# Patient Record
Sex: Male | Born: 1995 | Race: White | Hispanic: No | Marital: Single | Smoking: Never smoker
Health system: Southern US, Community
[De-identification: ages and names within clinical notes are randomized; demographics above are authoritative.]

## PROBLEM LIST (undated history)

## (undated) DIAGNOSIS — S6990XA Unspecified injury of unspecified wrist, hand and finger(s), initial encounter: Secondary | ICD-10-CM

## (undated) DIAGNOSIS — J45909 Unspecified asthma, uncomplicated: Secondary | ICD-10-CM

## (undated) HISTORY — DX: Unspecified injury of unspecified wrist, hand and finger(s), initial encounter: S69.90XA

## (undated) HISTORY — PX: ANTERIOR CRUCIATE LIGAMENT REPAIR: SHX115

## (undated) HISTORY — DX: Unspecified asthma, uncomplicated: J45.909

## (undated) HISTORY — PX: CLEFT LIP REPAIR: SUR1164

---
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
## 2016-03-04 ENCOUNTER — Ambulatory Visit (INDEPENDENT_AMBULATORY_CARE_PROVIDER_SITE_OTHER): Payer: BLUE CROSS/BLUE SHIELD | Admitting: Physician Assistant

## 2016-03-04 DIAGNOSIS — Z23 Encounter for immunization: Secondary | ICD-10-CM | POA: Diagnosis not present

## 2016-04-06 ENCOUNTER — Ambulatory Visit (INDEPENDENT_AMBULATORY_CARE_PROVIDER_SITE_OTHER): Payer: BLUE CROSS/BLUE SHIELD | Admitting: Physician Assistant

## 2016-04-06 VITALS — BP 108/72 | HR 69 | Temp 97.3°F | Resp 17 | Ht 69.57 in | Wt 159.0 lb

## 2016-04-06 DIAGNOSIS — J209 Acute bronchitis, unspecified: Secondary | ICD-10-CM | POA: Diagnosis not present

## 2016-04-06 DIAGNOSIS — R0981 Nasal congestion: Secondary | ICD-10-CM | POA: Diagnosis not present

## 2016-04-06 MED ORDER — BENZONATATE 100 MG PO CAPS: 100.0000 mg | ORAL_CAPSULE | Freq: Three times a day (TID) | ORAL | 0 refills | Status: AC | PRN

## 2016-04-06 MED ORDER — AZITHROMYCIN 250 MG PO TABS: ORAL_TABLET | ORAL | 0 refills | Status: AC

## 2016-04-06 MED ORDER — FLUTICASONE PROPIONATE 50 MCG/ACT NA SUSP: 2.0000 | Freq: Every day | NASAL | 0 refills | Status: AC

## 2016-04-06 NOTE — Patient Instructions (Addendum)
Please continue the mucinex.  1200mg  every 12 hours. Hydrate well with 64 oz of water daily. Please take the antibiotic as prescribed. Please let me know if there is no improvement of your symptoms.  Acute Bronchitis, Adult Acute bronchitis is when air tubes (bronchi) in the lungs suddenly get swollen. The condition can make it hard to breathe. It can also cause these symptoms:  A cough.  Coughing up clear, yellow, or green mucus.  Wheezing.  Chest congestion.  Shortness of breath.  A fever.  Body aches.  Chills.  A sore throat. Follow these instructions at home: Medicines  Take over-the-counter and prescription medicines only as told by your doctor.  If you were prescribed an antibiotic medicine, take it as told by your doctor. Do not stop taking the antibiotic even if you start to feel better. General instructions  Rest.  Drink enough fluids to keep your pee (urine) clear or pale yellow.  Avoid smoking and secondhand smoke. If you smoke and you need help quitting, ask your doctor. Quitting will help your lungs heal faster.  Use an inhaler, cool mist vaporizer, or humidifier as told by your doctor.  Keep all follow-up visits as told by your doctor. This is important. How is this prevented? To lower your risk of getting this condition again:  Wash your hands often with soap and water. If you cannot use soap and water, use hand sanitizer.  Avoid contact with people who have cold symptoms.  Try not to touch your hands to your mouth, nose, or eyes.  Make sure to get the flu shot every year. Contact a doctor if:  Your symptoms do not get better in 2 weeks. Get help right away if:  You cough up blood.  You have chest pain.  You have very bad shortness of breath.  You become dehydrated.  You faint (pass out) or keep feeling like you are going to pass out.  You keep throwing up (vomiting).  You have a very bad headache.  Your fever or chills gets  worse. This information is not intended to replace advice given to you by your health care provider. Make sure you discuss any questions you have with your health care provider. Document Released: 09/13/2007 Document Revised: 11/03/2015 Document Reviewed: 09/15/2015 Elsevier Interactive Patient Education  2017 [employer]   IF you received an x-ray today, you will receive an invoice from [facility]. Please contact [facility] at [phone] with questions or concerns regarding your invoice.   IF you received labwork today, you will receive an invoice from [facility]. Please contact [facility] at [phone] with questions or concerns regarding your invoice.   Our billing staff will not be able to assist you with questions regarding bills from these companies.  You will be contacted with the lab results as soon as they are available. The fastest way to get your results is to activate your My Chart account. Instructions are located on the last page of this paperwork. If you have not heard from us regarding the results in 2 weeks, please contact this office.

## 2016-04-06 NOTE — Progress Notes (Signed)
[facility] [address], [place] [id number] [phone]  Date:  04/06/2016   Name:  Jonathan Miniondam D Schnurr   DOB:  21-Mar-1996   MRN:  [id number]  PCP:  No PCP Per Patient    History of Present Illness:  Jonathan Joyce is a 20 y.o. male patient who [facility] cc of about 1 month of cough.    Coughing up sputum initially brown mucus.  He was placed on steroid to get rid of virus, and cough suppressant (hydrocodone).  Virus initially but has gone away.  Cough went away, but then returns.  There is productive cough, is mostly yellow at this time.  Cough is present throughout day and night.  He does have nasal congestion.  No ear pain.  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  He has some windedness with breathing.  [employer] training.   Patient Active Problem List   Diagnosis Date Noted  . Status post repair of anterior cruciate ligament 05/14/2014    Past Medical History:  Diagnosis Date  . Asthma   . Wrist injury     Past Surgical History:  Procedure Laterality Date  . ANTERIOR CRUCIATE LIGAMENT REPAIR Left   . CLEFT LIP REPAIR  1998   most recent revision 09/2011    Social History  Substance Use Topics  . Smoking status: Never Smoker  . Smokeless tobacco: Never Used  . Alcohol use No    History reviewed. No pertinent family history.  No Known Allergies  Medication list has been reviewed and updated.  No current outpatient prescriptions on file prior to visit.   No current facility-administered medications on file prior to visit.     ROS   Physical Examination: BP 108/72 (BP Location: Right Arm, Patient Position: Sitting, Cuff Size: Small)   Pulse 69   Temp 97.3 F (36.3 C) (Oral)   Resp 17   Ht 5' 9.57" (1.767 m)   Wt 159 lb (72.1 kg)   SpO2 99%   BMI 23.10 kg/m  Ideal Body Weight: Weight in (lb) to have BMI = 25: 171.7  Physical Exam  Constitutional: He is oriented  to person, place, and time. He appears well-developed and well-nourished. No distress.  HENT:  Head: Atraumatic.  Right Ear: Tympanic membrane, external ear and ear canal normal.  Left Ear: Tympanic membrane, external ear and ear canal normal.  Nose: Mucosal edema (right nostril) and rhinorrhea present. Right sinus exhibits no maxillary sinus tenderness and no frontal sinus tenderness. Left sinus exhibits no maxillary sinus tenderness and no frontal sinus tenderness.  Mouth/Throat: No uvula swelling. No oropharyngeal exudate, posterior oropharyngeal edema or posterior oropharyngeal erythema.  Eyes: Conjunctivae, EOM and lids are normal. Pupils are equal, round, and reactive to light. Right eye exhibits normal extraocular motion. Left eye exhibits normal extraocular motion.  Neck: Trachea normal and full passive range of motion without pain. No edema and no erythema present.  Cardiovascular: Normal rate.   Pulmonary/Chest: Effort normal. No respiratory distress. He has no decreased breath sounds. He has no wheezes. He has no rhonchi.  Neurological: He is alert and oriented to person, place, and time.  Skin: Skin is warm and dry. He is not diaphoretic.  Psychiatric: He has a normal mood and affect. His behavior is normal.     Assessment and Plan: Jonathan Miniondam D Fehl is a 20 y.o. male who is here today for  cough 1 month. Possibility of bronchitis, walking pneumonia, viral bronchitis, post nasal drip.  Patient hx of nasal swelling could be normal. After 1 month of symptoms will cover for bacterial etiology.  Advised to return if coughing does not improve.  He will try the tessalon pearls.  Will hold off on controlled med cough suppressant at this time.  Patient voiced understanding. Acute bronchitis, unspecified organism - Plan: azithromycin (ZITHROMAX) 250 MG tablet, benzonatate (TESSALON) 100 MG capsule, fluticasone (FLONASE) 50 MCG/ACT nasal spray  Nasal congestion - Plan: fluticasone (FLONASE) 50  MCG/ACT nasal spray  Trena PlattStephanie English, PA-C [facility] 12/28/20173:40 PM

## 2016-07-12 ENCOUNTER — Encounter: Payer: Self-pay | Admitting: Student

## 2016-07-12 ENCOUNTER — Telehealth: Payer: Self-pay | Admitting: Physician Assistant

## 2016-07-12 NOTE — Telephone Encounter (Signed)
Please advise 

## 2016-07-12 NOTE — Telephone Encounter (Signed)
Immunizations reviewed (EMR and [facility]).  Patient needs: 1. Inactivated Polio vaccine dose after the age of 47. This is a requirement of the DoD, not what we typically follow. He will need to obtain this vaccine at the health department.  2. I have only one dose of chicken pox vaccine documented. Most likely because we did not start giving the booster dose until he was older. So, we need either a second dose of Varivax (at the health department) or a positive titer (if the titer is negative, he'd have to go get the vaccine).  3. TB screening performed by phone. No testing indicated.  Spoke with mom. She will pick up the forms as completed.

## 2016-07-12 NOTE — Telephone Encounter (Signed)
Jonathan Joyce - Pt needs immunization forms filled out for college.  His mom brought by everything including the immunization forms.  He has not been here since December of last year.  She requests you to fill this out because she thinks you are most familiar with him.  Please call her asap at [phone].  I have left the forms in your box.

## 2018-04-04 DIAGNOSIS — M67912 Unspecified disorder of synovium and tendon, left shoulder: Secondary | ICD-10-CM | POA: Diagnosis not present

## 2018-04-04 DIAGNOSIS — M25512 Pain in left shoulder: Secondary | ICD-10-CM | POA: Diagnosis not present

## 2018-04-09 DIAGNOSIS — M67912 Unspecified disorder of synovium and tendon, left shoulder: Secondary | ICD-10-CM | POA: Diagnosis not present

## 2019-03-31 DIAGNOSIS — D225 Melanocytic nevi of trunk: Secondary | ICD-10-CM | POA: Diagnosis not present

## 2019-03-31 DIAGNOSIS — Z1283 Encounter for screening for malignant neoplasm of skin: Secondary | ICD-10-CM | POA: Diagnosis not present
